# Patient Record
Sex: Male | Born: 1990 | Race: Black or African American | Hispanic: No | Marital: Single | Smoking: Current some day smoker
Health system: Southern US, Community
[De-identification: ages and names within clinical notes are randomized; demographics above are authoritative.]

## PROBLEM LIST (undated history)

## (undated) DIAGNOSIS — I639 Cerebral infarction, unspecified: Secondary | ICD-10-CM

## (undated) DIAGNOSIS — I10 Essential (primary) hypertension: Secondary | ICD-10-CM

## (undated) HISTORY — DX: Essential (primary) hypertension: I10

## (undated) HISTORY — DX: Cerebral infarction, unspecified: I63.9

---
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
## 2017-09-07 ENCOUNTER — Emergency Department (HOSPITAL_COMMUNITY): Payer: Self-pay

## 2017-09-07 ENCOUNTER — Other Ambulatory Visit: Payer: Self-pay

## 2017-09-07 ENCOUNTER — Encounter (HOSPITAL_COMMUNITY): Payer: Self-pay | Admitting: Emergency Medicine

## 2017-09-07 DIAGNOSIS — L03115 Cellulitis of right lower limb: Secondary | ICD-10-CM | POA: Insufficient documentation

## 2017-09-07 DIAGNOSIS — B353 Tinea pedis: Secondary | ICD-10-CM | POA: Insufficient documentation

## 2017-09-07 NOTE — ED Triage Notes (Signed)
Pt c/o right foot pain with swelling since yesterday. Pt denies any injury.

## 2017-09-08 ENCOUNTER — Emergency Department (HOSPITAL_COMMUNITY)
Admission: EM | Admit: 2017-09-08 | Discharge: 2017-09-08 | Disposition: A | Discharge status: Home / Self Care | Payer: Self-pay | Attending: Emergency Medicine | Admitting: Emergency Medicine

## 2017-09-08 DIAGNOSIS — L03119 Cellulitis of unspecified part of limb: Secondary | ICD-10-CM

## 2017-09-08 DIAGNOSIS — B353 Tinea pedis: Secondary | ICD-10-CM

## 2017-09-08 MED ORDER — CEPHALEXIN 250 MG/5ML PO SUSR: 500.0000 mg | Freq: Four times a day (QID) | ORAL | 0 refills | Status: AC

## 2017-09-08 NOTE — ED Notes (Signed)
Pt ambulatory to waiting room. Pt verbalized understanding of discharge instructions.   

## 2017-09-08 NOTE — ED Provider Notes (Signed)
[facility]GENCY DEPARTMENT Provider Note   CSN: [id number] Arrival date & time: 09/07/17  2108     History   Chief Complaint Chief Complaint  Patient presents with  . Foot Pain    HPI Jose Cross is a 27 y.o. male.  Patient is a 27 year old male presenting for evaluation of right foot pain and swelling.  This is been worsening over the past several days.  He denies any injury or trauma.  He denies any fevers or chills.   The history is provided by the patient.  Foot Pain  This is a new problem. Episode onset: 3 days ago. The problem occurs constantly. The problem has been rapidly worsening. The symptoms are aggravated by walking. Nothing relieves the symptoms. He has tried nothing for the symptoms.    History reviewed. No pertinent past medical history.  There are no active problems to display for this patient.   History reviewed. No pertinent surgical history.     Home Medications    Prior to Admission medications   Not on File    Family History No family history on file.  Social History Social History   Tobacco Use  . Smoking status: Never Smoker  . Smokeless tobacco: Never Used  Substance Use Topics  . Alcohol use: Yes    Frequency: Never  . Drug use: Yes    Types: Marijuana     Allergies   Patient has no allergy information on record.   Review of Systems Review of Systems  All other systems reviewed and are negative.    Physical Exam Updated Vital Signs BP (!) 154/90 (BP Location: Right Arm)   Pulse 92   Temp (!) 97.4 F (36.3 C) (Oral)   Resp 18   SpO2 99%   Physical Exam  Constitutional: He is oriented to person, place, and time. He appears well-developed and well-nourished. No distress.  HENT:  Head: Normocephalic and atraumatic.  Neck: Normal range of motion. Neck supple.  Pulmonary/Chest: Effort normal.  Neurological: He is alert and oriented to person, place, and time.  Skin: Skin is warm and dry. He is not  diaphoretic.  The left foot is noted to have redness, swelling, and warmth to the dorsum.  There is some cracking between the toes consistent with athlete's foot.  Nursing note and vitals reviewed.    ED Treatments / Results  Labs (all labs ordered are listed, but only abnormal results are displayed) Labs Reviewed - No data to display  EKG  EKG Interpretation None       Radiology Dg Foot Complete Right  Result Date: 09/07/2017 CLINICAL DATA:  Generalized right foot pain and swelling since yesterday without known injury. EXAM: RIGHT FOOT COMPLETE - 3+ VIEW COMPARISON:  None. FINDINGS: Soft tissue swelling along the mid and forefoot more so dorsomedially. No soft tissue mineralization or mass. No extra articular erosions. No acute fracture, bone destruction or joint dislocations. No radiopaque foreign body is identified. IMPRESSION: Nonspecific soft tissue swelling of the mid and forefoot more so dorsomedially. No acute osseous appearing abnormality. No radiographic findings of crystalline arthropathy or osteomyelitis. Electronically Signed   By: Tollie Ethavid  Kwon M.D.   On: 09/07/2017 21:57    Procedures Procedures (including critical care time)  Medications Ordered in ED Medications - No data to display   Initial Impression / Assessment and Plan / ED Course  I have reviewed the triage vital signs and the nursing notes.  Pertinent labs & imaging results that  were available during my care of the patient were reviewed by me and considered in my medical decision making (see chart for details).  X-rays are negative for acute abnormality.  This appears to be a cellulitis which will be treated with Keflex.  Final Clinical Impressions(s) / ED Diagnoses   Final diagnoses:  None    ED Discharge Orders    None       Jose Lyons, MD 09/08/17 0230

## 2017-09-08 NOTE — Discharge Instructions (Signed)
Keflex as prescribed.  Ibuprofen 600 mg every 6 hours as needed for pain.  Follow-up with your primary doctor if not improving in the next several days.

## 2019-05-04 IMAGING — DX DG FOOT COMPLETE 3+V*R*
3 series · 3 of 3 positions shown · non-contrast
Comparison: None.

CLINICAL DATA: Generalized right foot pain and swelling since
yesterday without known injury.

EXAM:
RIGHT FOOT COMPLETE - 3+ VIEW

[foot obl]
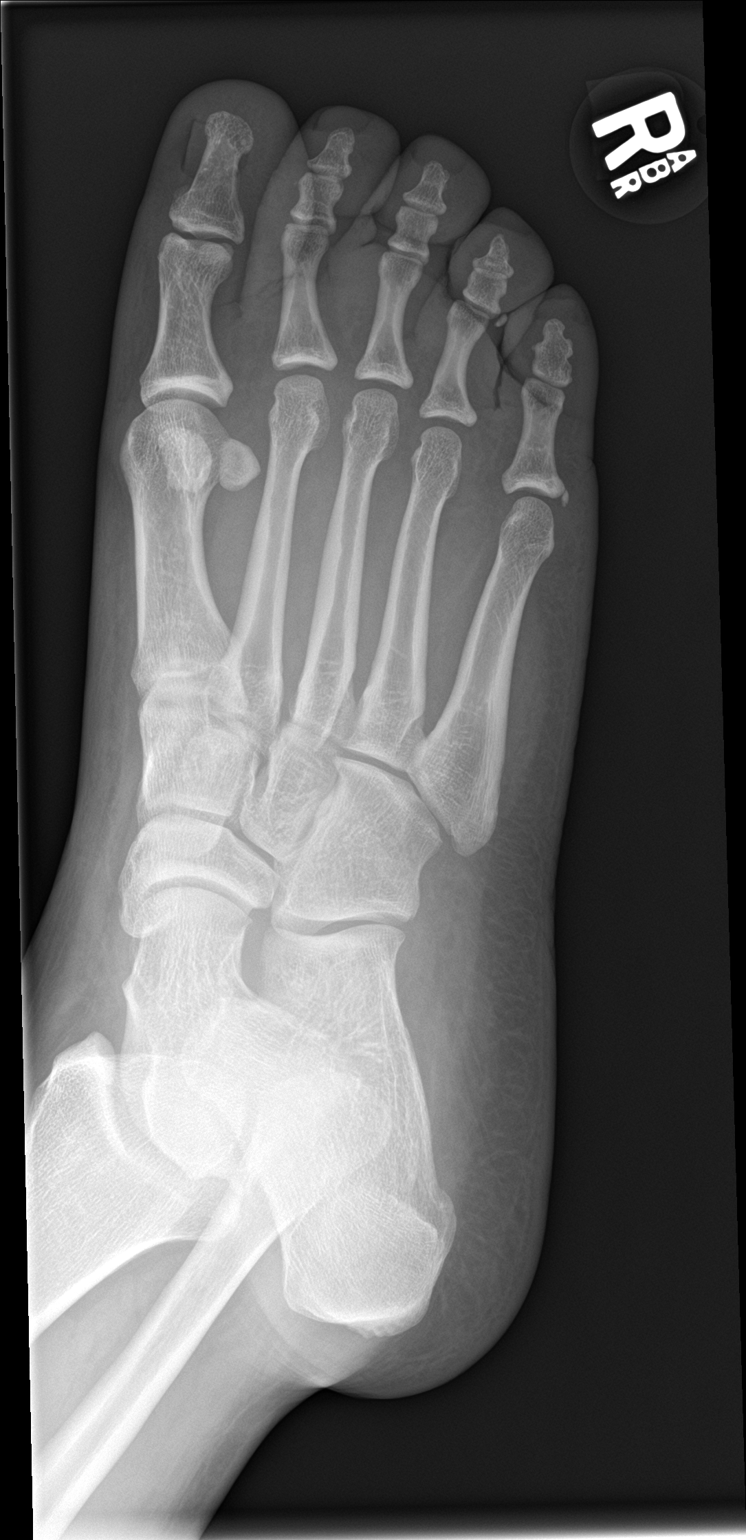

[foot lat]
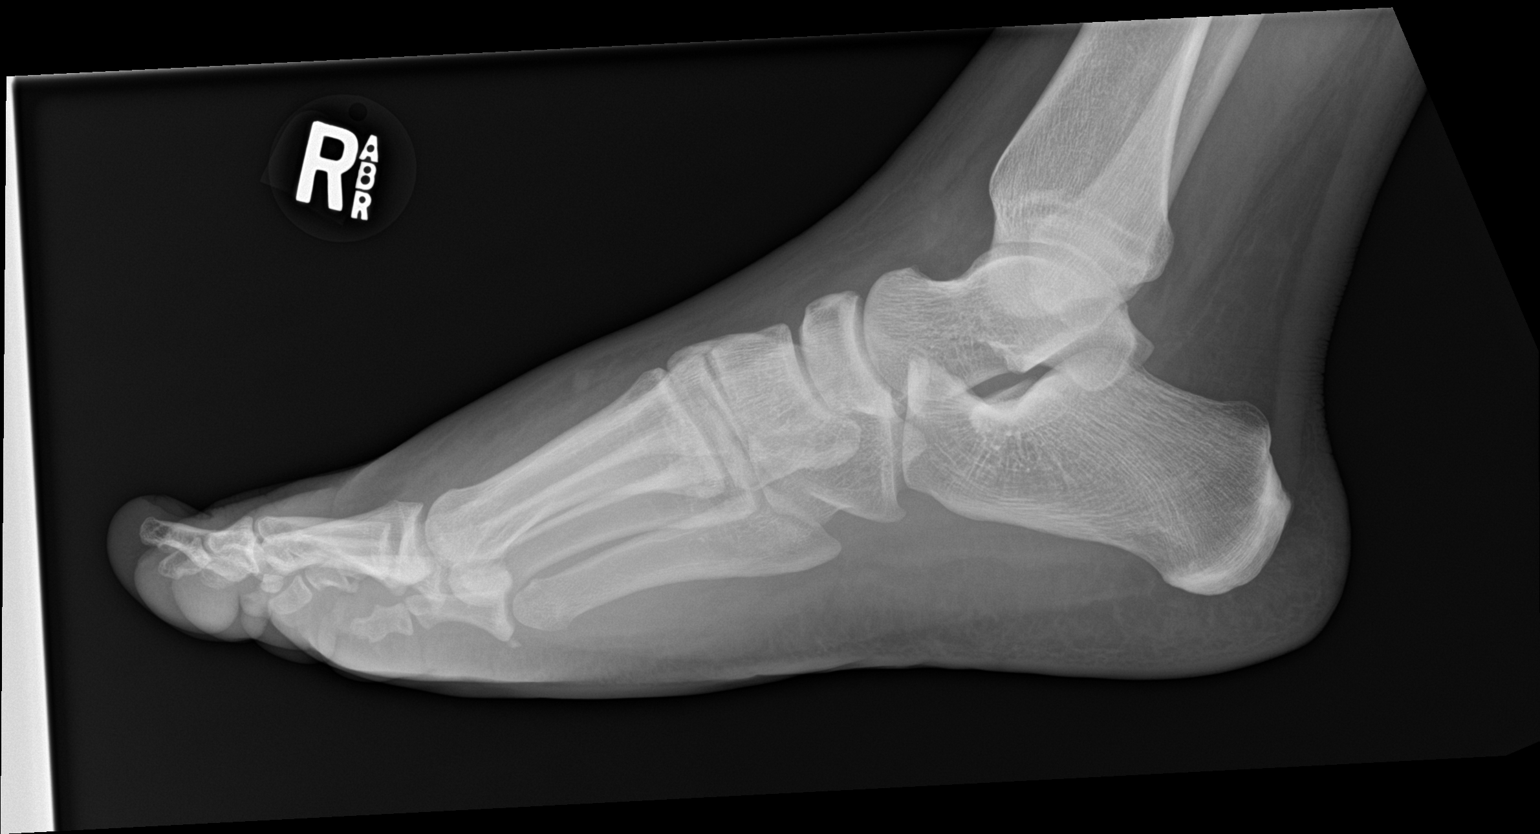

[foot ap]
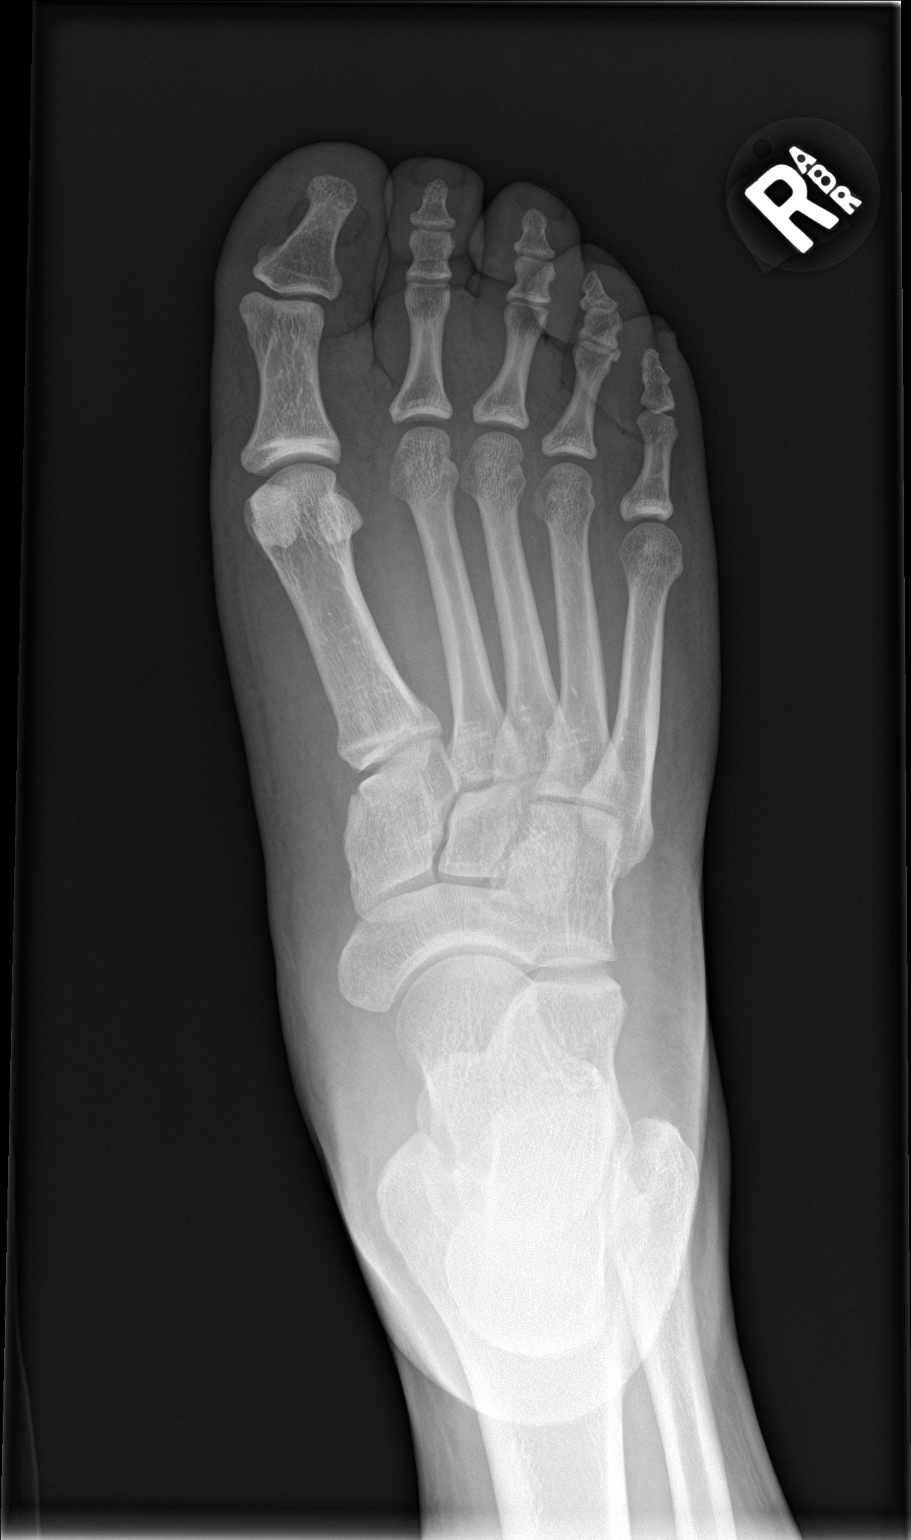

[3 of 3 positions shown; findings below may reference images not displayed]

FINDINGS: Soft tissue swelling along the mid and forefoot more so
dorsomedially. No soft tissue mineralization or mass. No extra
articular erosions. No acute fracture, bone destruction or joint
dislocations. No radiopaque foreign body is identified.
IMPRESSION: Nonspecific soft tissue swelling of the mid and forefoot more so
dorsomedially. No acute osseous appearing abnormality. No
radiographic findings of crystalline arthropathy or osteomyelitis.

## 2023-08-06 ENCOUNTER — Other Ambulatory Visit: Payer: Self-pay

## 2023-08-06 ENCOUNTER — Emergency Department (HOSPITAL_COMMUNITY)
Admission: EM | Admit: 2023-08-06 | Discharge: 2023-08-07 | Disposition: A | Discharge status: Home / Self Care | Payer: Commercial Managed Care - HMO | Attending: Emergency Medicine | Admitting: Emergency Medicine

## 2023-08-06 ENCOUNTER — Encounter (HOSPITAL_COMMUNITY): Payer: Self-pay

## 2023-08-06 DIAGNOSIS — R519 Headache, unspecified: Secondary | ICD-10-CM | POA: Diagnosis present

## 2023-08-06 DIAGNOSIS — I10 Essential (primary) hypertension: Secondary | ICD-10-CM | POA: Diagnosis not present

## 2023-08-06 DIAGNOSIS — H5712 Ocular pain, left eye: Secondary | ICD-10-CM | POA: Diagnosis not present

## 2023-08-06 NOTE — ED Triage Notes (Signed)
States he feels like he has pressure behind his  left eye. States this al started yesterday. He though it was related to a few alcohol drinks he had yesterday, but the pressure continues today and has become worse. States he has vomited 3 times, which relieved some pressure. Also states his blood pressure is high. Patient does not have a PCP and is not taking any BP medication.

## 2023-08-07 ENCOUNTER — Emergency Department (HOSPITAL_COMMUNITY): Payer: Commercial Managed Care - HMO

## 2023-08-07 ENCOUNTER — Telehealth: Payer: Self-pay

## 2023-08-07 LAB — URINALYSIS, ROUTINE W REFLEX MICROSCOPIC
Bilirubin Urine: NEGATIVE
Glucose, UA: NEGATIVE mg/dL
Hgb urine dipstick: NEGATIVE
Ketones, ur: NEGATIVE mg/dL
Leukocytes,Ua: NEGATIVE
Nitrite: NEGATIVE
Protein, ur: NEGATIVE mg/dL
Specific Gravity, Urine: 1.012 (ref 1.005–1.030)
pH: 8 (ref 5.0–8.0)

## 2023-08-07 LAB — BASIC METABOLIC PANEL
Anion gap: 10 (ref 5–15)
BUN: 9 mg/dL (ref 6–20)
CO2: 23 mmol/L (ref 22–32)
Calcium: 9.6 mg/dL (ref 8.9–10.3)
Chloride: 104 mmol/L (ref 98–111)
Creatinine, Ser: 0.83 mg/dL (ref 0.61–1.24)
GFR, Estimated: >60 mL/min (ref >60)
Glucose, Bld: 120 mg/dL — ABNORMAL HIGH (ref 70–99)
Potassium: 3.7 mmol/L (ref 3.5–5.1)
Sodium: 137 mmol/L (ref 135–145)

## 2023-08-07 LAB — CBC WITH DIFFERENTIAL/PLATELET
Abs Immature Granulocytes: 0.02 10*3/uL (ref 0.00–0.07)
Basophils Absolute: 0.1 10*3/uL (ref 0.0–0.1)
Basophils Relative: 1 %
Eosinophils Absolute: 0 10*3/uL (ref 0.0–0.5)
Eosinophils Relative: 0 %
HCT: 42.1 % (ref 39.0–52.0)
Hemoglobin: 14.1 g/dL (ref 13.0–17.0)
Immature Granulocytes: 0 %
Lymphocytes Relative: 14 %
Lymphs Abs: 1.2 10*3/uL (ref 0.7–4.0)
MCH: 30.5 pg (ref 26.0–34.0)
MCHC: 33.5 g/dL (ref 30.0–36.0)
MCV: 90.9 fL (ref 80.0–100.0)
Monocytes Absolute: 0.5 10*3/uL (ref 0.1–1.0)
Monocytes Relative: 5 %
Neutro Abs: 6.8 10*3/uL (ref 1.7–7.7)
Neutrophils Relative %: 80 %
Platelets: 293 10*3/uL (ref 150–400)
RBC: 4.63 MIL/uL (ref 4.22–5.81)
RDW: 12.8 % (ref 11.5–15.5)
WBC: 8.6 10*3/uL (ref 4.0–10.5)
nRBC: 0 % (ref 0.0–0.2)

## 2023-08-07 LAB — SEDIMENTATION RATE: Sed Rate: 15 mm/h (ref 0–16)

## 2023-08-07 MED ORDER — KETOROLAC TROMETHAMINE 15 MG/ML IJ SOLN
15.0000 mg | Freq: Once | INTRAMUSCULAR | Status: AC
  Administered 2023-08-07: 15 mg via INTRAVENOUS
  Filled 2023-08-07: qty 1

## 2023-08-07 MED ORDER — DIPHENHYDRAMINE HCL 50 MG/ML IJ SOLN
25.0000 mg | Freq: Once | INTRAMUSCULAR | Status: AC
  Administered 2023-08-07: 25 mg via INTRAVENOUS
  Filled 2023-08-07: qty 1

## 2023-08-07 MED ORDER — IOHEXOL 350 MG/ML SOLN
75.0000 mL | Freq: Once | INTRAVENOUS | Status: AC | PRN
  Administered 2023-08-07: 75 mL via INTRAVENOUS

## 2023-08-07 MED ORDER — METOCLOPRAMIDE HCL 5 MG/ML IJ SOLN
10.0000 mg | Freq: Once | INTRAMUSCULAR | Status: AC
  Administered 2023-08-07: 10 mg via INTRAVENOUS
  Filled 2023-08-07: qty 2

## 2023-08-07 MED ORDER — GADOBUTROL 1 MMOL/ML IV SOLN
10.0000 mL | Freq: Once | INTRAVENOUS | Status: AC | PRN
  Administered 2023-08-07: 10 mL via INTRAVENOUS

## 2023-08-07 MED ORDER — HYDRALAZINE HCL 20 MG/ML IJ SOLN
5.0000 mg | Freq: Once | INTRAMUSCULAR | Status: AC
  Administered 2023-08-07: 5 mg via INTRAVENOUS
  Filled 2023-08-07: qty 1

## 2023-08-07 MED ORDER — ASPIRIN 81 MG PO CHEW: 81.0000 mg | CHEWABLE_TABLET | Freq: Every day | ORAL | 0 refills | Status: AC

## 2023-08-07 MED ORDER — AMLODIPINE BESYLATE 5 MG PO TABS: 5.0000 mg | ORAL_TABLET | Freq: Every day | ORAL | 0 refills | Status: DC

## 2023-08-07 MED ORDER — FLUORESCEIN SODIUM 1 MG OP STRP
1.0000 | ORAL_STRIP | Freq: Once | OPHTHALMIC | Status: AC
  Administered 2023-08-07: 1 via OPHTHALMIC
  Filled 2023-08-07: qty 1

## 2023-08-07 MED ORDER — TETRACAINE HCL 0.5 % OP SOLN
2.0000 [drp] | Freq: Once | OPHTHALMIC | Status: AC
  Administered 2023-08-07: 2 [drp] via OPHTHALMIC
  Filled 2023-08-07: qty 4

## 2023-08-07 NOTE — Discharge Instructions (Addendum)
Take your blood pressure medication as prescribed.  Keep a record of your blood pressure at home and follow-up with your primary doctor for further medication adjustments.  Follow-up with the ophthalmologist regarding your eye pain. Take your blood pressure medication as prescribed.  Also take a baby aspirin 81 mg daily.  Follow-up with the neurologist for further evaluation of your elevated blood pressure and old stroke. Thank you for the opportunity to take care of you in our Emergency Department. You have been diagnosed with high blood pressure, also known as hypertension. This means that the force of blood against the walls of your blood vessels called is too strong. It also means that your heart has to work harder to move the blood. High blood pressure usually has no symptoms, but over time, it can cause serious health problems such as Heart attack and heart failure Stroke Kidney disease and failure Vision loss With the help from your healthcare provider and some important life style changes, you can manage your blood pressure and protect your health. Please read the instructions provided on hypertension, how to manage it and how to check your blood pressure. Additionally, use the blood pressure log provided to record your blood pressures. Take the blood pressure log with you to your primary care doctor so that they can adjust your blood pressure medications if needed. Please read the instructions on follow-up appointment. Return to the ER or Call 911 right away if you have any of these symptoms: Chest pain or shortness of breath Severe headache Weakness, tingling, or numbness of your face, arms, or legs (especially on 1 side of the body) Sudden change in vision Confusion, trouble speaking, or trouble understanding speech

## 2023-08-07 NOTE — ED Provider Notes (Signed)
[place] EMERGENCY DEPARTMENT AT [facility] Provider Note   CSN: [id number] Arrival date & time: 08/06/23  2300     History  Chief Complaint  Patient presents with   Eye Problem    Jose Cross is a 32 y.o. male.  Patient Jose Cross to the ED with "eye pain" which has been ongoing for the past 2 days.  States he had several drinks in the evening of December 19 and woke up the morning of December 20 with pain behind his left eye that comes and goes.  It waxes and wanes in severity but never goes away completely.  Feels like there is pain and pressure behind his left eye but denies having a headache.  He was not certain if this feeling could be from elevated blood pressure could be from a hangover.  Denies any blurry vision or double vision.  Denies any spots in his vision.  He had 2 episodes of vomiting today and feels like the pressure is less behind his eye.  Denies any eye trauma.  No focal weakness, numbness or tingling.  No room spinning dizziness or lightheadedness.  He does wear glasses at baseline but states his vision is unchanged.  No fevers.  No chest pain or shortness of breath.  No pain with eye movements  Hypertensive on arrival.  States he does have a known history of hypertension that was diagnosed at urgent care last month.  He has a PCP appointment in January to see a doctor.  He has never been on blood pressure medication.  The history is provided by the patient and a relative.  Eye Problem Associated symptoms: headaches, nausea and vomiting   Associated symptoms: no discharge, no itching, no photophobia, no redness and no weakness        Home Medications Prior to Admission medications   Not on File      Allergies    Patient has no known allergies.    Review of Systems   Review of Systems  Constitutional:  Negative for activity change, appetite change and fever.  HENT:  Negative for congestion and rhinorrhea.   Eyes:  Positive for pain.  Negative for photophobia, discharge, redness, itching and visual disturbance.  Respiratory:  Negative for cough and shortness of breath.   Cardiovascular:  Negative for chest pain.  Gastrointestinal:  Positive for nausea and vomiting. Negative for abdominal pain.  Genitourinary:  Negative for dysuria and hematuria.  Musculoskeletal:  Negative for arthralgias and myalgias.  Skin:  Negative for rash.  Neurological:  Positive for headaches. Negative for weakness.   all other systems are negative except as noted in the HPI and PMH.    Physical Exam Updated Vital Signs BP (!) 195/115 (BP Location: Left Arm)   Pulse 74   Temp 97.6 F (36.4 C)   Resp 15   Ht 5\' 9"  (1.753 m)   Wt 93.9 kg   SpO2 100%   BMI 30.57 kg/m  Physical Exam Vitals and nursing note reviewed.  Constitutional:      General: He is not in acute distress.    Appearance: He is well-developed.  HENT:     Head: Normocephalic and atraumatic.     Mouth/Throat:     Pharynx: No oropharyngeal exudate.  Eyes:     General: No visual field deficit.    Intraocular pressure: Left eye pressure is 18 mmHg. Measurements were taken using a handheld tonometer.    Extraocular Movements: Extraocular movements intact.  Conjunctiva/sclera: Conjunctivae normal.     Pupils: Pupils are equal, round, and reactive to light.     Left eye: No corneal abrasion or fluorescein uptake.     Slit lamp exam:    Left eye: No foreign body, hyphema or hypopyon.     Comments: No hyphema or hypopyon Visual fields full to confrontation.  No corneal abrasion.  Neck:     Comments: No meningismus. Cardiovascular:     Rate and Rhythm: Normal rate and regular rhythm.     Heart sounds: Murmur heard.  Pulmonary:     Effort: Pulmonary effort is normal. No respiratory distress.     Breath sounds: Normal breath sounds.  Abdominal:     Palpations: Abdomen is soft.     Tenderness: There is no abdominal tenderness. There is no guarding or rebound.   Musculoskeletal:        General: No tenderness. Normal range of motion.     Cervical back: Normal range of motion and neck supple.  Skin:    General: Skin is warm.  Neurological:     Mental Status: He is alert and oriented to person, place, and time.     Cranial Nerves: No cranial nerve deficit.     Motor: No abnormal muscle tone.     Coordination: Coordination normal.     Comments: No ataxia on finger to nose bilaterally. No pronator drift. 5/5 strength throughout. CN 2-12 intact.Equal grip strength. Sensation intact.   Psychiatric:        Behavior: Behavior normal.     ED Results / Procedures / Treatments   Labs (all labs ordered are listed, but only abnormal results are displayed) Labs Reviewed  BASIC METABOLIC PANEL - Abnormal; Notable for the following components:      Result Value   Glucose, Bld 120 (*)    All other components within normal limits  URINALYSIS, ROUTINE W REFLEX MICROSCOPIC - Abnormal; Notable for the following components:   Color, Urine STRAW (*)    All other components within normal limits  CBC WITH DIFFERENTIAL/PLATELET  SEDIMENTATION RATE    EKG EKG Interpretation Date/Time:  Monday August 07 2023 02:43:05 EST Ventricular Rate:  80 PR Interval:  160 QRS Duration:  89 QT Interval:  358 QTC Calculation: 413 R Axis:   93  Text Interpretation: Sinus rhythm Borderline right axis deviation No previous ECGs available Confirmed by Glynn Octave [phone]) on 08/07/2023 3:01:30 AM  Radiology MR Brain W and Wo Contrast Result Date: 08/07/2023 CLINICAL DATA:  Headache, increasing frequency or severity EXAM: MRI HEAD AND ORBITS WITHOUT AND WITH CONTRAST TECHNIQUE: Multiplanar, multiecho pulse sequences of the brain and surrounding structures were obtained without and with intravenous contrast. Multiplanar, multiecho pulse sequences of the orbits and surrounding structures were obtained including fat saturation techniques, before and after intravenous  contrast administration. CONTRAST:  10mL GADAVIST GADOBUTROL 1 MMOL/ML IV SOLN COMPARISON:  None Available. FINDINGS: MRI HEAD FINDINGS Brain: No restricted diffusion with ADC correlate to suggest acute infarct. A focus of increased signal in the right posterior lentiform nucleus on the diffusion-weighted sequence (series 7, image 58) correlates with normal to slightly increased signal on the ADC map and increased signal on T2 weighted imaging, possibly reflecting a subacute infarct or T2 shine through from more remote infarct. No evidence of acute hemorrhage, mass, mass effect or midline shift. No hydrocephalus or extra-axial collection. No hemosiderin deposition to suggest remote hemorrhage Scattered foci of T2 hyperintense signal in the periventricular and subcortical white  matter, which are nonspecific and can be seen in the setting of prior insult, chronic migraines, or early small vessel ischemic disease; these can also be seen in the setting of demyelinating disease, although the pattern is not particularly suggestive of demyelination. Vascular: Normal arterial flow voids. Normal arterial and venous enhancement. Skull and upper cervical spine: Normal marrow signal. MRI ORBITS FINDINGS Orbits: No abnormal enhancement. No traumatic or inflammatory finding. Globes, optic nerves, orbital fat, extraocular muscles, vascular structures, and lacrimal glands are normal. Visualized sinuses: Minimal mucosal thickening in the ethmoid air cells. Otherwise clear paranasal sinuses. The mastoids are well aerated. Soft tissues: Negative. IMPRESSION: 1. No acute intracranial process. 2. No evidence of acute infarct. 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 3. Scattered foci of T2 hyperintense signal in the periventricular and subcortical white matter, which are nonspecific and can be seen in the setting of prior insult, chronic migraines, or early small vessel ischemic disease. The pattern is not  particularly suggestive of demyelination. 4. No acute finding in the orbits. Electronically Signed   By: Wiliam Ke M.D.   On: 08/07/2023 04:08   MR ORBITS W WO CONTRAST Result Date: 08/07/2023 CLINICAL DATA:  Headache, increasing frequency or severity EXAM: MRI HEAD AND ORBITS WITHOUT AND WITH CONTRAST TECHNIQUE: Multiplanar, multiecho pulse sequences of the brain and surrounding structures were obtained without and with intravenous contrast. Multiplanar, multiecho pulse sequences of the orbits and surrounding structures were obtained including fat saturation techniques, before and after intravenous contrast administration. CONTRAST:  10mL GADAVIST GADOBUTROL 1 MMOL/ML IV SOLN COMPARISON:  None Available. FINDINGS: MRI HEAD FINDINGS Brain: No restricted diffusion with ADC correlate to suggest acute infarct. A focus of increased signal in the right posterior lentiform nucleus on the diffusion-weighted sequence (series 7, image 58) correlates with normal to slightly increased signal on the ADC map and increased signal on T2 weighted imaging, possibly reflecting a subacute infarct or T2 shine through from more remote infarct. No evidence of acute hemorrhage, mass, mass effect or midline shift. No hydrocephalus or extra-axial collection. No hemosiderin deposition to suggest remote hemorrhage Scattered foci of T2 hyperintense signal in the periventricular and subcortical white matter, which are nonspecific and can be seen in the setting of prior insult, chronic migraines, or early small vessel ischemic disease; these can also be seen in the setting of demyelinating disease, although the pattern is not particularly suggestive of demyelination. Vascular: Normal arterial flow voids. Normal arterial and venous enhancement. Skull and upper cervical spine: Normal marrow signal. MRI ORBITS FINDINGS Orbits: No abnormal enhancement. No traumatic or inflammatory finding. Globes, optic nerves, orbital fat, extraocular  muscles, vascular structures, and lacrimal glands are normal. Visualized sinuses: Minimal mucosal thickening in the ethmoid air cells. Otherwise clear paranasal sinuses. The mastoids are well aerated. Soft tissues: Negative. IMPRESSION: 1. No acute intracranial process. 2. No evidence of acute infarct. 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 3. Scattered foci of T2 hyperintense signal in the periventricular and subcortical white matter, which are nonspecific and can be seen in the setting of prior insult, chronic migraines, or early small vessel ischemic disease. The pattern is not particularly suggestive of demyelination. 4. No acute finding in the orbits. Electronically Signed   By: Wiliam Ke M.D.   On: 08/07/2023 04:08   CT ANGIO HEAD NECK W WO CM Result Date: 08/07/2023 CLINICAL DATA:  Pressure behind his left eye, vomiting, hypertension EXAM: CT ANGIOGRAPHY HEAD AND NECK WITH AND  WITHOUT CONTRAST TECHNIQUE: Multidetector CT imaging of the head and neck was performed using the standard protocol during bolus administration of intravenous contrast. Multiplanar CT image reconstructions and MIPs were obtained to evaluate the vascular anatomy. Carotid stenosis measurements (when applicable) are obtained utilizing NASCET criteria, using the distal internal carotid diameter as the denominator. RADIATION DOSE REDUCTION: This exam was performed according to the departmental dose-optimization program which includes automated exposure control, adjustment of the mA and/or kV according to patient size and/or use of iterative reconstruction technique. CONTRAST:  75mL OMNIPAQUE IOHEXOL 350 MG/ML SOLN COMPARISON:  None Available. FINDINGS: CT HEAD FINDINGS Brain: No evidence of acute infarct, hemorrhage, mass, mass effect, or midline shift. No hydrocephalus or extra-axial fluid collection. Vascular: No hyperdense vessel. Skull: Negative for fracture or focal lesion. Sinuses/Orbits: Clear  paranasal sinuses. No acute finding in the orbits. Other: Fluid in the right mastoid air cells. CTA NECK FINDINGS Aortic arch: Standard branching. Imaged portion shows no evidence of aneurysm or dissection. No significant stenosis of the major arch vessel origins. Right carotid system: No evidence of stenosis, dissection, or occlusion. Left carotid system: No evidence of stenosis, dissection, or occlusion. Vertebral arteries: No evidence of stenosis, dissection, or occlusion. Skeleton: No acute osseous abnormality. Other neck: Prominent upper cervical lymph nodes, including a left level 1A lymph node that measures up to 5 mm in short axis, a left level 2B lymph node that measures up to 8 mm in short axis, and a right level 2B lymph node that measures up to 8 mm in short axis. No abnormal density lymph nodes. Upper chest: No focal pulmonary opacity or pleural effusion. Review of the MIP images confirms the above findings CTA HEAD FINDINGS Anterior circulation: Both internal carotid arteries are patent to the termini, without significant stenosis. A1 segments patent. Normal anterior communicating artery. Anterior cerebral arteries are patent to their distal aspects without significant stenosis. No M1 stenosis or occlusion. MCA branches perfused to their distal aspects without significant stenosis. Posterior circulation: Vertebral arteries patent to the vertebrobasilar junction without significant stenosis. Posterior inferior cerebellar arteries patent proximally. Basilar patent to its distal aspect without significant stenosis. Superior cerebellar arteries patent proximally. Patent P1 segments. PCAs perfused to their distal aspects without significant stenosis. The bilateral posterior communicating arteries are not visualized. Venous sinuses: As permitted by contrast timing, patent. Anatomic variants: None significant. No evidence of aneurysm or vascular malformation. Review of the MIP images confirms the above  findings IMPRESSION: 1. No acute intracranial process. 2. No intracranial large vessel occlusion or significant stenosis. 3. No hemodynamically significant stenosis in the neck. 4. Prominent upper cervical lymph nodes, which are nonspecific and may be reactive. Electronically Signed   By: Wiliam Ke M.D.   On: 08/07/2023 02:05    Procedures Procedures    Medications Ordered in ED Medications  fluorescein ophthalmic strip 1 strip (has no administration in time range)  tetracaine (PONTOCAINE) 0.5 % ophthalmic solution 2 drop (has no administration in time range)  hydrALAZINE (APRESOLINE) injection 5 mg (has no administration in time range)  ketorolac (TORADOL) 15 MG/ML injection 15 mg (has no administration in time range)  metoCLOPramide (REGLAN) injection 10 mg (has no administration in time range)  diphenhydrAMINE (BENADRYL) injection 25 mg (has no administration in time range)    ED Course/ Medical Decision Making/ A&P  Medical Decision Making Amount and/or Complexity of Data Reviewed Labs: ordered. Decision-making details documented in ED Course. Radiology: ordered and independent interpretation performed. Decision-making details documented in ED Course. ECG/medicine tests: ordered and independent interpretation performed. Decision-making details documented in ED Course.  Risk OTC drugs. Prescription drug management.   3 days of intermittent pain and pressure behind left eye that comes and goes.  Does not feel like this is a headache.  2 episodes of vomiting today which improved.  Hypertensive on arrival with history of same.  Neurological exam is nonfocal.  Intraocular pressure is normal. No areas of fluorescein uptake. Visual Acuity  Bilateral Near 20/25      Bilateral Distance 20/40      R Near 20/32      R Distance 20/50      L Near 20/32      L Distance 20/40       Labs obtained to evaluate for endorgan damage from his hypertension.   Creatinine is normal.  Urinalysis is negative.  No proteinuria.  CT head obtained given his headache and eye pain. Given a migraine cocktail as well.  After 5 mg of amlodipine, blood pressure has improved to 175/96.  Patient reports resolution of head and eye pain after receiving Toradol, Reglan and Benadryl.  Low suspicion for glaucoma or intraocular hemorrhage.  CT head negative for hemorrhage or large vessel occlusion.  No aneurysm.  MRI negative for acute infarct.  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  Discussed with Dr. Derry Lory  neurology who feels this is chronic and recommends blood pressure control and aspirin and outpatient follow-up.  MRI does show questionable white matter changes that are nonspecific.  Not favored to represent demyelination.  Discussed with patient he need strict blood pressure control, start baby aspirin, follow-up with PCP as well as neurology.  Will start amlodipine.  Advised patient to get blood pressure cuff for home and check blood pressure on a regular basis.  Follow up ophthalmology regarding his eye pain which has resolved after receiving headache cocktail.  Return to the ED with headache, visual changes, chest pain, shortness of breath, nausea, vomiting, sweating or other concerns.  Discussed with patient and mother at bedside.  Discussed strict blood pressure control, PCP and neurology follow-up.  Start baby aspirin.  Follow-up with PCP for further blood pressure medication adjustments.  Return to the ED with exertional chest pain, pain associate with shortness of breath, nausea, vomiting, sweating, headache, visual changes, unilateral weakness, numbness, tingling or other concerns.       Final Clinical Impression(s) / ED Diagnoses Final diagnoses:  Left eye pain  Bad headache  Uncontrolled hypertension    Rx / DC Orders ED Discharge Orders     None         Jendayi Berling, Jeannett Senior, MD 08/07/23  [phone]

## 2023-08-07 NOTE — Progress Notes (Unsigned)
Care Guide Pharmacy Note  08/07/2023 Name: Jose Cross MRN: [id number] DOB: 1990-09-26  Referred By: Patient, No Pcp Per Reason for referral: Care Coordination (Outreach to schedule with Pharm d )   Jose Cross is a 32 y.o. year old male who is a primary care patient of Patient, No Pcp Per.  Norman Clay was referred to the pharmacist for assistance related to: HTN  An unsuccessful telephone outreach was attempted today to contact the patient who was referred to the pharmacy team for assistance with medication management. Additional attempts will be made to contact the patient.  Penne Lash , RMA     [facility]  [facility], [facility] Guide  Direct Dial: [phone]  Website: [email or web address]

## 2023-08-11 ENCOUNTER — Other Ambulatory Visit: Payer: Commercial Managed Care - HMO

## 2023-08-11 NOTE — Progress Notes (Signed)
   08/11/2023  Patient ID: Jose Cross, male   DOB: 10-27-1990, 32 y.o.   MRN: [id number]  Contacted patient via telephone to follow up on recent ED visit for HTN.  Confirmed patient is taking Amlodipine 5mg  daily and Aspirin 81mg  once daily.  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 Checks BP daily at 5pm once he arrives to work and is reporting readings of >170/>100. However, is checking BP and then taking amlodipine at the same time.  Counseled on proper BP monitoring technique. Patient expressed understanding. Counseled on limiting sodium intake.  Patient would like appt with a new PCP as soon as possible, will coordinate with Tyson Dense.   Patient also states he has not heard back on referral to Neuro. Provided their contact info and also reminded him to schedule with opthamalogy as requested at ED. Patient expressed understanding.  If BP still elevated, would consider increase of Amlodipine to 10mg  daily and if markedly elevated, consider addition of thiazide diuretic or ACEI/ARB.  Sherrill Raring, PharmD Clinical Pharmacist [phone]

## 2023-09-07 ENCOUNTER — Telehealth: Payer: Self-pay

## 2023-09-07 NOTE — Telephone Encounter (Signed)
Left voicemail to call back to schedule new patient appt

## 2023-09-12 ENCOUNTER — Encounter: Payer: Self-pay | Admitting: Neurology

## 2023-09-12 ENCOUNTER — Ambulatory Visit: Payer: Self-pay | Admitting: Neurology

## 2023-09-12 VITALS — BP 146/90 | HR 86 | Ht 70.0 in | Wt 202.0 lb

## 2023-09-12 DIAGNOSIS — I6381 Other cerebral infarction due to occlusion or stenosis of small artery: Secondary | ICD-10-CM

## 2023-09-12 DIAGNOSIS — I10 Essential (primary) hypertension: Secondary | ICD-10-CM

## 2023-09-12 MED ORDER — PROPRANOLOL HCL ER 60 MG PO CP24: 60.0000 mg | ORAL_CAPSULE | Freq: Every day | ORAL | 3 refills | Status: DC

## 2023-09-12 MED ORDER — AMLODIPINE BESYLATE 10 MG PO TABS: 10.0000 mg | ORAL_TABLET | Freq: Every day | ORAL | 3 refills | Status: DC

## 2023-09-12 NOTE — Progress Notes (Signed)
[facility]  PATIENT: Jose Cross DOB: 1991-02-12  REQUESTING CLINICIAN: Glynn Octave, MD HISTORY FROM: Patient  REASON FOR VISIT: Stroke found on MRI    HISTORICAL  CHIEF COMPLAINT:  Chief Complaint  Patient presents with   New Patient (Initial Visit)    Pt in 12, here alone Pt is referred by ED for white matter disease and chronic infarct on MRI. Pt states he was at work on Dec/22/24 and started getting pressure behind his left eye, started sweating profusely and feeling nauseous, ended in the ER and was informed he had a TIA and MRI showed TIA as well. Pt states he continues to have throbbing/pounding pain behind his left eye that happens randomly.     HISTORY OF PRESENT ILLNESS:  This is a 33 year old gentleman with no reported past medical history who is presenting after being last seen in the hospital for headaches and found to have stroke on MRI.  Patient reports a few weeks prior to his hospital presentation, he started developing left-sided headaches, behind the left eye.  Describes headache as pounding.  On December 22, he did have severe headache with nausea and vomiting and presented to the ED.  In the ED, his MRI showed white matter ischemic changes and subacute stroke in the right lentiform nucleus.  He was also noted to be hypertensive.  Started on amlodipine 5 mg.  Since discharge from the hospital he continues to have headaches, about 3 days a week lasting hours, sometime he can last the whole day.  He has been taking aspirin 81 mg daily without relief.  Patient reports his blood pressure is still elevated in the 150s systolic at home.  He has not set up care with the primary care doctor yet. He denies any previous history of headaches, no family history of headaches and no previous focal neurological deficits.    OTHER MEDICAL CONDITIONS: Hypertension    REVIEW OF SYSTEMS: Full 14 system review of systems performed and negative with  exception of: As noted in the HPI  ALLERGIES: No Known Allergies  HOME MEDICATIONS: Outpatient Medications Prior to Visit  Medication Sig Dispense Refill   aspirin 81 MG chewable tablet Chew 1 tablet (81 mg total) by mouth daily. 30 tablet 0   amLODipine (NORVASC) 5 MG tablet Take 1 tablet (5 mg total) by mouth daily. 30 tablet 0   No facility-administered medications prior to visit.    PAST MEDICAL HISTORY: Past Medical History:  Diagnosis Date   Hypertension     PAST SURGICAL HISTORY: History reviewed. No pertinent surgical history.  FAMILY HISTORY: Family History  Problem Relation Age of Onset   Stroke Maternal Uncle     SOCIAL HISTORY: Social History   Socioeconomic History   Marital status: Single    Spouse name: Not on file   Number of children: Not on file   Years of education: Not on file   Highest education level: Not on file  Occupational History   Occupation: TEam  [employer]    Comment: [employer] [facility]  Tobacco Use   Smoking status: Some Days    Types: Cigars   Smokeless tobacco: Never   Tobacco comments:    Black and milds. States has not smoked since December.   Vaping Use   Vaping status: Never Used  Substance and Sexual Activity   Alcohol use: Yes    Alcohol/week: 2.0 standard drinks of alcohol    Types: 1 Cans of beer, 1 Shots of  liquor per week    Comment: occ   Drug use: Yes    Types: Marijuana   Sexual activity: Not on file  Other Topics Concern   Not on file  Social History Narrative   Not on file   Social Drivers of Health   Financial Resource Strain: Not on file  Food Insecurity: Not on file  Transportation Needs: Not on file  Physical Activity: Not on file  Stress: Not on file  Social Connections: Not on file  Intimate Partner Violence: Not on file    PHYSICAL EXAM  GENERAL EXAM/CONSTITUTIONAL: Vitals:  Vitals:   09/12/23 0809 09/12/23 0825  BP: (!) 152/102 (!) 146/90  Pulse: 88 86  Weight: 202 lb (91.6 kg)    Height: 5\' 10"  (1.778 m)    Body mass index is 28.98 kg/m. Wt Readings from Last 3 Encounters:  09/12/23 202 lb (91.6 kg)  08/06/23 207 lb (93.9 kg)   Patient is in no distress; well developed, nourished and groomed; neck is supple  MUSCULOSKELETAL: Gait, strength, tone, movements noted in Neurologic exam below  NEUROLOGIC: MENTAL STATUS:      No data to display         awake, alert, oriented to person, place and time recent and remote memory intact normal attention and concentration language fluent, comprehension intact, naming intact fund of knowledge appropriate  CRANIAL NERVE:  2nd, 3rd, 4th, 6th - Visual fields full to confrontation, extraocular muscles intact, no nystagmus 5th - facial sensation symmetric 7th - facial strength symmetric 8th - hearing intact 9th - palate elevates symmetrically, uvula midline 11th - shoulder shrug symmetric 12th - tongue protrusion midline  MOTOR:  normal bulk and tone, full strength in the BUE, BLE  SENSORY:  normal and symmetric to light touch  COORDINATION:  finger-nose-finger, fine finger movements normal  REFLEXES:  deep tendon reflexes present and symmetric  GAIT/STATION:  normal   DIAGNOSTIC DATA (LABS, IMAGING, TESTING) - I reviewed patient records, labs, notes, testing and imaging myself where available.  Lab Results  Component Value Date   WBC 8.6 08/07/2023   HGB 14.1 08/07/2023   HCT 42.1 08/07/2023   MCV 90.9 08/07/2023   PLT 293 08/07/2023      Component Value Date/Time   NA 137 08/07/2023 0045   K 3.7 08/07/2023 0045   CL 104 08/07/2023 0045   CO2 23 08/07/2023 0045   GLUCOSE 120 (H) 08/07/2023 0045   BUN 9 08/07/2023 0045   CREATININE 0.83 08/07/2023 0045   CALCIUM 9.6 08/07/2023 0045   GFRNONAA >60 08/07/2023 0045   No results found for: "CHOL", "HDL", "LDLCALC", "LDLDIRECT", "TRIG", "CHOLHDL" No results found for: "HGBA1C" No results found for: "VITAMINB12" No results found for:  "TSH"  MRI Brain 08/07/2023 1. No acute intracranial process. 2. No evidence of acute infarct. 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 3. Scattered foci of T2 hyperintense signal in the periventricular and subcortical white matter, which are nonspecific and can be seen in the setting of prior insult, chronic migraines, or early small vessel ischemic disease. The pattern is not particularly suggestive of demyelination. 4. No acute finding in the orbits     ASSESSMENT AND PLAN  33 y.o. year old male with history of hypertension who is presenting with complaint of headaches and found to have stroke on MRI.  Stroke etiology likely small vessel disease from hypertension.  He is currently on amlodipine 5, will increase it to 10 and add  propranolol.  Will also obtain stroke lab including lipid panel and hemoglobin A1c. Will start statin if needed. For his headaches, based on description, these are likely migraine headaches, we will start him on propranolol as preventive medication and hopefully this will also better control his blood pressure.  Advised patient to try Tylenol or Motrin as needed for the headaches.  We also discussed need of setting up care with PCP.  I have shown him on the [facility] website how to chose and actually schedule an appointment.  He will continue to follow-up with PCP and return as needed.   1. 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 Hypertension, unspecified type      Patient Instructions  Stroke labs including Lipid panel and Hemoglobin A1C  Increase amlodipine to 10 mg daily  Start Propanolol 60 mg daily  Continue Tylenol Motrin as needed for headaches Set up care with PCP  Return as needed   Orders Placed This Encounter  Procedures   Lipid panel   Hemoglobin A1c    Meds ordered this encounter  Medications   amLODipine (NORVASC) 10 MG tablet    Sig: Take 1 tablet (10 mg total) by mouth  daily.    Dispense:  90 tablet    Refill:  3   propranolol ER (INDERAL LA) 60 MG 24 hr capsule    Sig: Take 1 capsule (60 mg total) by mouth daily.    Dispense:  90 capsule    Refill:  3    Return if symptoms worsen or fail to improve.    Windell Norfolk, MD 09/12/2023, 9:55 AM  [facility] [address], [address] [place], [place] [address] [phone]

## 2023-09-12 NOTE — Patient Instructions (Addendum)
Stroke labs including Lipid panel and Hemoglobin A1C  Increase amlodipine to 10 mg daily  Start Propanolol 60 mg daily  Continue Tylenol Motrin as needed for headaches Set up care with PCP  Return as needed

## 2023-09-13 LAB — LIPID PANEL
Chol/HDL Ratio: 3.1 {ratio} (ref 0.0–5.0)
Cholesterol, Total: 141 mg/dL (ref 100–199)
HDL: 46 mg/dL (ref >39)
LDL Chol Calc (NIH): 79 mg/dL (ref 0–99)
Triglycerides: 81 mg/dL (ref 0–149)
VLDL Cholesterol Cal: 16 mg/dL (ref 5–40)

## 2023-09-13 LAB — HEMOGLOBIN A1C
Est. average glucose Bld gHb Est-mCnc: 117 mg/dL
Hgb A1c MFr Bld: 5.7 % — ABNORMAL HIGH (ref 4.8–5.6)

## 2023-09-14 ENCOUNTER — Telehealth: Payer: Self-pay

## 2023-09-14 NOTE — Progress Notes (Signed)
Please call and advise the patient that the recent stroke labs were within normal limits except for a slightly elevated LDL of 79. No further action is required on these tests at this time, as I believe your stroke was due to elevated blood pressure. Please remind patient to keep any upcoming appointments or tests and to call us with any interim questions, concerns, problems or updates. Thanks,   Windell Norfolk, MD

## 2023-09-14 NOTE — Telephone Encounter (Signed)
Call to patient, reviewed results and patient verbalized understanding. He states the Propanolol ER  60 mg tablet is too big for him to swallow. He is requesting a change in dose or medication. Advised I will send to Dr. Teresa Coombs for review.

## 2023-09-20 ENCOUNTER — Other Ambulatory Visit: Payer: Self-pay | Admitting: Neurology

## 2023-09-20 MED ORDER — PROPRANOLOL HCL 20 MG PO TABS: 20.0000 mg | ORAL_TABLET | Freq: Two times a day (BID) | ORAL | 6 refills | Status: DC

## 2023-09-20 NOTE — Telephone Encounter (Signed)
 Call to patient, advised that Dr. Samara Crest sent in new dose of propanolol with new instructions. Patient verbalized understanding and will let pharmacy know to cancel 60 mg dose.

## 2023-09-20 NOTE — Telephone Encounter (Signed)
Propanolol 60 changed to 20 mg BID.

## 2023-10-26 ENCOUNTER — Ambulatory Visit: Payer: Self-pay | Admitting: Neurology

## 2023-11-17 ENCOUNTER — Other Ambulatory Visit: Payer: Self-pay | Admitting: General Practice

## 2023-11-17 NOTE — Telephone Encounter (Signed)
 Copied from CRM [id number]. Topic: Clinical - Medication Refill >> Nov 17, 2023  3:40 PM Dondra Prader A wrote: Most Recent Primary Care Visit:  Provider: Delano Metz H  Department: [facility]  Visit Type: PATIENT OUTREACH 30  Date: 08/11/2023  Medication: amLODipine (NORVASC) 10 MG tablet propranolol (INDERAL) 20 MG tablet  Has the patient contacted their pharmacy? No   Is this the correct pharmacy for this prescription? Yes  This is the patient's preferred pharmacy:  [id number] - [place], [place] - [address] AT [address] OF [address] [place] [address] Phone: [phone] Fax: [phone]   Has the prescription been filled recently? No  Is the patient out of the medication? No Patient has 3 days worth of medication left.   Has the patient been seen for an appointment in the last year OR does the patient have an upcoming appointment? Yes Patient has a new patient appt set for 01/17/2024. Currently does not have a PCP.   Can we respond through MyChart? No  Agent: Please be advised that Rx refills may take up to 3 business days. We ask that you follow-up with your pharmacy.

## 2023-12-04 ENCOUNTER — Ambulatory Visit (HOSPITAL_BASED_OUTPATIENT_CLINIC_OR_DEPARTMENT_OTHER): Admitting: Family Medicine

## 2024-01-17 ENCOUNTER — Encounter (HOSPITAL_BASED_OUTPATIENT_CLINIC_OR_DEPARTMENT_OTHER): Payer: Self-pay | Admitting: Family Medicine

## 2024-01-17 ENCOUNTER — Ambulatory Visit (HOSPITAL_BASED_OUTPATIENT_CLINIC_OR_DEPARTMENT_OTHER): Admitting: Family Medicine

## 2024-01-17 VITALS — BP 156/102 | HR 83 | Ht 69.0 in | Wt 202.5 lb

## 2024-01-17 DIAGNOSIS — Z0000 Encounter for general adult medical examination without abnormal findings: Secondary | ICD-10-CM

## 2024-01-17 DIAGNOSIS — F419 Anxiety disorder, unspecified: Secondary | ICD-10-CM | POA: Diagnosis not present

## 2024-01-17 DIAGNOSIS — R7303 Prediabetes: Secondary | ICD-10-CM | POA: Diagnosis not present

## 2024-01-17 DIAGNOSIS — I10 Essential (primary) hypertension: Secondary | ICD-10-CM | POA: Diagnosis not present

## 2024-01-17 MED ORDER — AMLODIPINE BESYLATE 10 MG PO TABS: 10.0000 mg | ORAL_TABLET | Freq: Every day | ORAL | 1 refills | Status: DC

## 2024-01-17 MED ORDER — ESCITALOPRAM OXALATE 5 MG PO TABS: 5.0000 mg | ORAL_TABLET | Freq: Every day | ORAL | 1 refills | Status: DC

## 2024-01-17 MED ORDER — PROPRANOLOL HCL 20 MG PO TABS: 20.0000 mg | ORAL_TABLET | Freq: Two times a day (BID) | ORAL | 1 refills | Status: DC

## 2024-01-17 NOTE — Assessment & Plan Note (Signed)
 Blood pressure is above goal in office today.  Patient has been without antihypertensive medication for couple months. We can look to resume medications.  Advised on restarting amlodipine  at lower dose for couple weeks and then increasing to 10 mg dose which he was taking previously.  Recommend intermittent monitoring of blood pressure at home, DASH diet.  Will monitor blood pressure closely to determine any further changes needed with medication regimen to better control blood pressure

## 2024-01-17 NOTE — Progress Notes (Signed)
 New Patient Office Visit  Subjective   Patient ID: Jose Cross, male    DOB: August 11, 1991  Age: 33 y.o. MRN: [id number]  CC:  Chief Complaint  Patient presents with   New Patient (Initial Visit)    New patient has not had pcp in awhile needs medication refills out of meds for 2 months     HPI Jose Cross presents to establish care Last PCP - none recently  HTN: Patient without recent PCP and thus has not had optimal management of blood pressure.  He was found to have elevated blood pressure during emergency department visit and was being managed on amlodipine  and propranolol , however is currently out of medications.  Has been without medications for about 2 months now.  No current issues with chest pain or headaches.  Denies having any issues with amlodipine  when taking this previously.  Did have blood pressure cuff at home, however reports that this was recently broken.  Migraines: was started on propanolol to assist with control, out of medication currently. Requesting refill today. Also with history of stroke, had evaluation with neurology. No follow-up scheduled now. Taking ASA 81.  Concerns about anxiety. Notes symptoms most days of the week. Notes increased anxiousness. Has had some symptoms when younger, but symptoms now are more so affecting daily activity and being more intrusive and affecting work and home life.  Diagnosed with chronic left lenticular nucleus infarct at emergency department visit.  Did have follow-up with neurologist.  As above, patient was started on propranolol .  He also takes ASA 81.  Patient is originally from [place]. 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 He enjoys playing video games, listening to music, exercising.  Outpatient Encounter Medications as of 01/17/2024  Medication Sig   escitalopram (LEXAPRO) 5 MG tablet Take 1 tablet (5 mg total) by mouth daily.   amLODipine  (NORVASC ) 10 MG tablet Take 1 tablet (10 mg total)  by mouth daily. Start with half tablet daily for 2 weeks, then increase to full tablet daily.   aspirin  81 MG chewable tablet Chew 1 tablet (81 mg total) by mouth daily. (Patient not taking: Reported on 01/17/2024)   propranolol  (INDERAL ) 20 MG tablet Take 1 tablet (20 mg total) by mouth 2 (two) times daily.   [DISCONTINUED] amLODipine  (NORVASC ) 10 MG tablet Take 1 tablet (10 mg total) by mouth daily.   [DISCONTINUED] propranolol  (INDERAL ) 20 MG tablet Take 1 tablet (20 mg total) by mouth 2 (two) times daily. (Patient not taking: Reported on 01/17/2024)   No facility-administered encounter medications on file as of 01/17/2024.    Past Medical History:  Diagnosis Date   Hypertension    Stroke [facility])     History reviewed. No pertinent surgical history.  Family History  Problem Relation Age of Onset   Stroke Maternal Uncle     Social History   Socioeconomic History   Marital status: Single    Spouse name: Not on file   Number of children: Not on file   Years of education: Not on file   Highest education level: 12th grade  Occupational History   Occupation: TEam  [employer]    Comment: [employer]  Tobacco Use   Smoking status: Some Days    Types: Cigars    Passive exposure: Current   Smokeless tobacco: Never   Tobacco comments:    Black and milds. States has not smoked since December.   Vaping Use   Vaping status: Never Used  Substance and Sexual Activity   Alcohol use: Yes    Alcohol/week: 2.0 standard drinks of alcohol    Types: 1 Cans of beer, 1 Shots of liquor per week    Comment: occ   Drug use: Yes    Types: Marijuana   Sexual activity: Not on file  Other Topics Concern   Not on file  Social History Narrative   Not on file   Social Drivers of Health   Financial Resource Strain: Low Risk  (01/15/2024)   Overall Financial Resource Strain (CARDIA)    Difficulty of Paying Living Expenses: Not very hard  Food Insecurity: No Food Insecurity (01/15/2024)   Hunger  Vital Sign    Worried About Running Out of Food in the Last Year: Never true    Ran Out of Food in the Last Year: Never true  Transportation Needs: No Transportation Needs (01/15/2024)   PRAPARE - Administrator, Civil Service (Medical): No    Lack of Transportation (Non-Medical): No  Physical Activity: Sufficiently Active (01/15/2024)   Exercise Vital Sign    Days of Exercise per Week: 3 days    Minutes of Exercise per Session: 60 min  Stress: Stress Concern Present (01/15/2024)   [employer] of Occupational Health - Occupational Stress Questionnaire    Feeling of Stress : Rather much  Social Connections: Socially Isolated (01/15/2024)   Social Connection and Isolation Panel [NHANES]    Frequency of Communication with Friends and Family: Twice a week    Frequency of Social Gatherings with Friends and Family: Once a week    Attends Religious Services: Never    Database administrator or Organizations: No    Attends Engineer, structural: Not on file    Marital Status: Separated  Intimate Partner Violence: Not on file    Objective   BP (!) 156/102 (BP Location: Right Arm, Patient Position: Sitting, Cuff Size: Normal)   Pulse 83   Ht 5\' 9"  (1.753 m)   Wt 202 lb 8 oz (91.9 kg)   SpO2 97%   BMI 29.90 kg/m   Physical Exam  33 year old male in no acute distress Cardiovascular exam with regular rate and rhythm Lungs clear to auscultation bilaterally  Assessment & Plan:   Primary hypertension Assessment & Plan: Blood pressure is above goal in office today.  Patient has been without antihypertensive medication for couple months. We can look to resume medications.  Advised on restarting amlodipine  at lower dose for couple weeks and then increasing to 10 mg dose which he was taking previously.  Recommend intermittent monitoring of blood pressure at home, DASH diet.  Will monitor blood pressure closely to determine any further changes needed with medication regimen  to better control blood pressure   Wellness examination -     CBC with Differential/Platelet; Future -     Comprehensive metabolic panel with GFR; Future -     Hemoglobin A1c; Future -     Lipid panel; Future -     TSH Rfx on Abnormal to Free T4; Future  Prediabetes Assessment & Plan: Hemoglobin A1c completed previously with neurology.  Was found to be within prediabetes range.  He reports family history of diabetes.  Discussed general conservative recommendations.  We can plan to monitor hemoglobin A1c moving forward   Anxiety Assessment & Plan: Given current symptoms, impact to your day-to-day life in multiple settings, do feel that patient has underlying anxiety.  We discussed treatment options.  We can  proceed with initial low-dose SSRI.  Cautioned on potential side effects.  Will plan to follow-up in about 2 weeks to assess progress with medication or sooner as needed.  Also discussed consideration for counseling/therapy.  Will hold off on this for now.  Can place referral at patient preference   Other orders -     Propranolol  HCl; Take 1 tablet (20 mg total) by mouth 2 (two) times daily.  Dispense: 180 tablet; Refill: 1 -     amLODIPine  Besylate; Take 1 tablet (10 mg total) by mouth daily. Start with half tablet daily for 2 weeks, then increase to full tablet daily.  Dispense: 90 tablet; Refill: 1 -     Escitalopram Oxalate; Take 1 tablet (5 mg total) by mouth daily.  Dispense: 30 tablet; Refill: 1  Return in about 2 weeks (around 01/31/2024) for med check, can be virtual.  CPE in 6 weeks with labs 1 week prior   ___________________________________________ Rachelanne Whidby de Peru, MD, ABFM, CAQSM Primary Care and Sports Medicine [facility]

## 2024-01-17 NOTE — Assessment & Plan Note (Signed)
 Given current symptoms, impact to your day-to-day life in multiple settings, do feel that patient has underlying anxiety.  We discussed treatment options.  We can proceed with initial low-dose SSRI.  Cautioned on potential side effects.  Will plan to follow-up in about 2 weeks to assess progress with medication or sooner as needed.  Also discussed consideration for counseling/therapy.  Will hold off on this for now.  Can place referral at patient preference

## 2024-01-17 NOTE — Patient Instructions (Signed)
  Medication Instructions:  Your physician recommends that you continue on your current medications as directed. Please refer to the Current Medication list given to you today. --If you need a refill on any your medications before your next appointment, please call your pharmacy first. If no refills are authorized on file call the office.-- Lab Work: Your physician has recommended that you have lab work today: 5 weeks If you have labs (blood work) drawn today and your tests are completely normal, you will receive your results via MyChart message OR a phone call from our staff.  Please ensure you check your voicemail in the event that you authorized detailed messages to be left on a delegated number. If you have any lab test that is abnormal or we need to change your treatment, we will call you to review the results.  Follow-Up: Your next appointment:   Your physician recommends that you schedule a follow-up appointment in: 2 weeks virtual / 6 weeks physical  with Dr. de Peru  You will receive a text message or e-mail with a link to a survey about your care and experience with us  today! We would greatly appreciate your feedback!   Thanks for letting us  be apart of your health journey!!  Primary Care and Sports Medicine   Dr. Court Distance Peru   We encourage you to activate your patient portal called "MyChart".  Sign up information is provided on this After Visit Summary.  MyChart is used to connect with patients for Virtual Visits (Telemedicine).  Patients are able to view lab/test results, encounter notes, upcoming appointments, etc.  Non-urgent messages can be sent to your provider as well. To learn more about what you can do with MyChart, please visit --  [email or web address].

## 2024-01-17 NOTE — Assessment & Plan Note (Signed)
 Hemoglobin A1c completed previously with neurology.  Was found to be within prediabetes range.  He reports family history of diabetes.  Discussed general conservative recommendations.  We can plan to monitor hemoglobin A1c moving forward

## 2024-02-01 ENCOUNTER — Encounter (HOSPITAL_BASED_OUTPATIENT_CLINIC_OR_DEPARTMENT_OTHER): Payer: Self-pay | Admitting: Family Medicine

## 2024-02-01 ENCOUNTER — Telehealth (HOSPITAL_BASED_OUTPATIENT_CLINIC_OR_DEPARTMENT_OTHER): Payer: Self-pay | Admitting: Family Medicine

## 2024-02-01 DIAGNOSIS — F419 Anxiety disorder, unspecified: Secondary | ICD-10-CM

## 2024-02-01 DIAGNOSIS — G43909 Migraine, unspecified, not intractable, without status migrainosus: Secondary | ICD-10-CM | POA: Insufficient documentation

## 2024-02-01 MED ORDER — ESCITALOPRAM OXALATE 10 MG PO TABS: 10.0000 mg | ORAL_TABLET | Freq: Every day | ORAL | 1 refills | Status: AC

## 2024-02-01 NOTE — Assessment & Plan Note (Signed)
 Prescription for propranolol  was sent to pharmacy, however patient indicates that this was not covered by insurance and was going to be expensive and thus he did not start medication.  Uncertain as to why this was not covered well.  We did review alternatives including utilization of goodrx.com which can assist with making medications more affordable if they are not covered well by insurance.  He will look further into this and verify any insurance coverage related to medication.  He does plan to start medication in the near future.  We will monitor this at next appointment

## 2024-02-01 NOTE — Progress Notes (Signed)
   Virtual Visit   I connected with  Jose Cross  on 02/01/24 by telehealth and verified that I am speaking with the correct person using two identifiers. Visit completed via video.   I discussed the limitations, risks, security and privacy concerns of performing an evaluation and management service by telephone, including the higher likelihood of inaccurate diagnosis and treatment, and the availability of in person appointments.  We also discussed the likely need of an additional face to face encounter for complete and high quality delivery of care.  I also discussed with the patient that there may be a patient responsible charge related to this service. The patient expressed understanding and wishes to proceed.  Provider location is in medical facility. Patient location is at their home, different from provider location. People involved in care of the patient during this telehealth encounter were myself, my nurse/medical assistant, and my front office/scheduling team member.  Review of Systems: No fevers, chills, night sweats, weight loss, chest pain, or shortness of breath.   Objective Findings:    General: Speaking full sentences, no audible heavy breathing.  Sounds alert and appropriately interactive.  Independent interpretation of tests performed by another provider:   None.  Brief History, Exam, Impression, and Recommendations:    Anxiety Patient did start with low-dose of escitalopram .  He indicates that he has been tolerating medication, however has not had significant change in regards to symptoms.  We discussed options today regarding medication management and he would be open to titrating dose to 10 mg.  Cautioned on potential side effects.  He will utilize medication that he has at home and I will also send prescription to pharmacy for 10 mg tablet.  Will plan to assess response in about 1 month at time of physical.  Migraines Prescription for propranolol  was sent to  pharmacy, however patient indicates that this was not covered by insurance and was going to be expensive and thus he did not start medication.  Uncertain as to why this was not covered well.  We did review alternatives including utilization of goodrx.com which can assist with making medications more affordable if they are not covered well by insurance.  He will look further into this and verify any insurance coverage related to medication.  He does plan to start medication in the near future.  We will monitor this at next appointment  I discussed the above assessment and treatment plan with the patient. The patient was provided an opportunity to ask questions and all were answered. The patient agreed with the plan and demonstrated an understanding of the instructions.  The patient was advised to call back or seek an in-person evaluation if the symptoms worsen or if the condition fails to improve as anticipated.  I provided 12 minutes of face to face and non-face-to-face time during this encounter date, time was needed to gather information, review chart, records, communicate/coordinate with staff remotely, as well as complete documentation.   ___________________________________________ Nabila Albarracin de [place], MD, ABFM, CAQSM Primary Care and Sports Medicine [facility]

## 2024-02-01 NOTE — Assessment & Plan Note (Signed)
 Patient did start with low-dose of escitalopram .  He indicates that he has been tolerating medication, however has not had significant change in regards to symptoms.  We discussed options today regarding medication management and he would be open to titrating dose to 10 mg.  Cautioned on potential side effects.  He will utilize medication that he has at home and I will also send prescription to pharmacy for 10 mg tablet.  Will plan to assess response in about 1 month at time of physical.

## 2024-03-13 ENCOUNTER — Encounter (HOSPITAL_BASED_OUTPATIENT_CLINIC_OR_DEPARTMENT_OTHER): Payer: Self-pay | Admitting: Family Medicine

## 2024-03-13 ENCOUNTER — Ambulatory Visit (HOSPITAL_BASED_OUTPATIENT_CLINIC_OR_DEPARTMENT_OTHER): Payer: Self-pay | Admitting: Family Medicine

## 2024-03-13 VITALS — BP 165/102 | HR 72 | Temp 99.7°F | Ht 69.0 in | Wt 202.3 lb

## 2024-03-13 DIAGNOSIS — I10 Essential (primary) hypertension: Secondary | ICD-10-CM

## 2024-03-13 DIAGNOSIS — Z0000 Encounter for general adult medical examination without abnormal findings: Secondary | ICD-10-CM | POA: Insufficient documentation

## 2024-03-13 NOTE — Assessment & Plan Note (Signed)
 Blood pressure notably elevated on initial reading, did improve on recheck.  Patient reports that due to financial strain, he has not been able to afford medication recently.  He has been without amlodipine  for about a week.  He does plan on picking medication up later this week. We again reviewed considerations, we discussed options to help with controlling cost of medications.  Discussed utilizing goodrx.com to review prices for prescriptions at local pharmacies.  Also discussed possible referral to [facility] to assist with medication, patient declines today. Recommend intermittent monitoring of blood pressure home, DASH diet We will plan for close follow-up on blood pressure

## 2024-03-13 NOTE — Progress Notes (Signed)
 Subjective:    CC: Annual Physical Exam  HPI:  Jose Cross is a 33 y.o. presenting for annual physical  I reviewed the past medical history, family history, social history, surgical history, and allergies today and no changes were needed.  Please see the problem list section below in epic for further details.  Past Medical History: Past Medical History:  Diagnosis Date   Hypertension    Stroke [facility])    Past Surgical History: History reviewed. No pertinent surgical history. 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 States has not smoked since December.   Vaping Use   Vaping status: Never Used  Substance and Sexual Activity   Alcohol use: Yes    Alcohol/week: 2.0 standard drinks of alcohol    Types: 1 Cans of beer, 1 Shots of liquor per week    Comment: occ   Drug use: Yes    Types: Marijuana   Sexual activity: Not on file  Other Topics Concern   Not on file  Social History Narrative   Not on file   Social Drivers of Health   Financial Resource Strain: Low Risk  (01/15/2024)   Overall Financial Resource Strain (CARDIA)    Difficulty of Paying Living Expenses: Not very hard  Food Insecurity: No Food Insecurity (01/15/2024)   Hunger Vital Sign    Worried About Running Out of Food in the Last Year: Never true    Ran Out of Food in the Last Year: Never true  Transportation Needs: No Transportation Needs (01/15/2024)   PRAPARE - Administrator, Civil Service (Medical): No    Lack of Transportation (Non-Medical): No  Physical Activity:  Sufficiently Active (01/15/2024)   Exercise Vital Sign    Days of Exercise per Week: 3 days    Minutes of Exercise per Session: 60 min  Stress: Stress Concern Present (01/15/2024)   [employer] of Occupational Health - Occupational Stress Questionnaire    Feeling of Stress : Rather much  Social Connections: Socially Isolated (01/15/2024)   Social Connection and Isolation Panel    Frequency of Communication with Friends and Family: Twice a week    Frequency of Social Gatherings with Friends and Family: Once a week    Attends Religious Services: Never    Database administrator or Organizations: No    Attends Engineer, structural: Not on file    Marital Status: Separated   Family History: Family History  Problem Relation Age of Onset   Stroke Maternal Uncle    Allergies: No Known Allergies Medications: See med rec.  Review of Systems: No headache, visual changes, nausea, vomiting, diarrhea, constipation, dizziness, abdominal pain, skin rash, fevers, chills, night sweats, swollen lymph nodes, weight loss, chest pain, body aches, joint swelling, muscle aches, shortness of breath, mood changes, visual or auditory hallucinations.  Objective:    BP (!) 165/102 (BP Location: Left Arm, Patient Position: Sitting, Cuff Size: Normal)   Pulse 72   Temp 99.7  F (37.6 C) (Oral)   Ht 5' 9 (1.753 m)   Wt 202 lb 4.8 oz (91.8 kg)   SpO2 99%   BMI 29.87 kg/m   General: Well Developed, well nourished, and in no acute distress. Neuro: Alert and oriented x3, extra-ocular muscles intact, sensation grossly intact. Cranial nerves II through XII are intact, motor, sensory, and coordinative functions are all intact. HEENT: Normocephalic, atraumatic, pupils equal round reactive to light, neck supple, no masses, no lymphadenopathy, thyroid nonpalpable. Oropharynx, nasopharynx, external ear canals are unremarkable. Skin: Warm and dry, no rashes noted. Cardiac: Regular rate and rhythm, no murmurs  rubs or gallops. Respiratory: Clear to auscultation bilaterally. Not using accessory muscles, speaking in full sentences. Abdominal: Soft, nontender, nondistended, positive bowel sounds, no masses, no organomegaly. Musculoskeletal: Shoulder, elbow, wrist, hip, knee, ankle stable, and with full range of motion.  Impression and Recommendations:    Wellness examination Assessment & Plan: Routine HCM labs ordered. HCM reviewed/discussed. Anticipatory guidance regarding healthy weight, lifestyle and choices given. Recommend healthy diet.  Recommend approximately 150 minutes/week of moderate intensity exercise Recommend regular dental and vision exams Always use seatbelt/lap and shoulder restraints Recommend using smoke alarms and checking batteries at least twice a year Recommend using sunscreen when outside Discussed immunization recommendations  Orders: -     TSH Rfx on Abnormal to Free T4 -     Lipid panel -     Hemoglobin A1c -     Comprehensive metabolic panel with GFR -     CBC with Differential/Platelet  Primary hypertension Assessment & Plan: Blood pressure notably elevated on initial reading, did improve on recheck.  Patient reports that due to financial strain, he has not been able to afford medication recently.  He has been without amlodipine  for about a week.  He does plan on picking medication up later this week. We again reviewed considerations, we discussed options to help with controlling cost of medications.  Discussed utilizing goodrx.com to review prices for prescriptions at local pharmacies.  Also discussed possible referral to [facility] to assist with medication, patient declines today. Recommend intermittent monitoring of blood pressure home, DASH diet We will plan for close follow-up on blood pressure   Return in about 4 weeks (around 04/10/2024) for hypertension, med check.   ___________________________________________ Rupal Childress de [place], MD, ABFM, [facility] Primary Care  and Sports Medicine [facility]

## 2024-03-13 NOTE — Assessment & Plan Note (Signed)

## 2024-03-14 LAB — CBC WITH DIFFERENTIAL/PLATELET
Basophils Absolute: 0.1 x10E3/uL (ref 0.0–0.2)
Basos: 1 %
EOS (ABSOLUTE): 0.1 x10E3/uL (ref 0.0–0.4)
Eos: 3 %
Hematocrit: 45 % (ref 37.5–51.0)
Hemoglobin: 14.8 g/dL (ref 13.0–17.7)
Immature Grans (Abs): 0 x10E3/uL (ref 0.0–0.1)
Immature Granulocytes: 0 %
Lymphocytes Absolute: 2.1 x10E3/uL (ref 0.7–3.1)
Lymphs: 43 %
MCH: 30.3 pg (ref 26.6–33.0)
MCHC: 32.9 g/dL (ref 31.5–35.7)
MCV: 92 fL (ref 79–97)
Monocytes Absolute: 0.6 x10E3/uL (ref 0.1–0.9)
Monocytes: 12 %
Neutrophils Absolute: 2 x10E3/uL (ref 1.4–7.0)
Neutrophils: 41 %
Platelets: 293 x10E3/uL (ref 150–450)
RBC: 4.88 x10E6/uL (ref 4.14–5.80)
RDW: 12.7 % (ref 11.6–15.4)
WBC: 4.8 x10E3/uL (ref 3.4–10.8)

## 2024-03-14 LAB — LIPID PANEL
Chol/HDL Ratio: 2.9 ratio (ref 0.0–5.0)
Cholesterol, Total: 164 mg/dL (ref 100–199)
HDL: 56 mg/dL (ref >39)
LDL Chol Calc (NIH): 96 mg/dL (ref 0–99)
Triglycerides: 59 mg/dL (ref 0–149)
VLDL Cholesterol Cal: 12 mg/dL (ref 5–40)

## 2024-03-14 LAB — COMPREHENSIVE METABOLIC PANEL WITH GFR
ALT: 18 IU/L (ref 0–44)
AST: 19 IU/L (ref 0–40)
Albumin: 4.6 g/dL (ref 4.1–5.1)
Alkaline Phosphatase: 68 IU/L (ref 44–121)
BUN/Creatinine Ratio: 10 (ref 9–20)
BUN: 9 mg/dL (ref 6–20)
Bilirubin Total: 0.5 mg/dL (ref 0.0–1.2)
CO2: 22 mmol/L (ref 20–29)
Calcium: 9.7 mg/dL (ref 8.7–10.2)
Chloride: 100 mmol/L (ref 96–106)
Creatinine, Ser: 0.89 mg/dL (ref 0.76–1.27)
Globulin, Total: 3 g/dL (ref 1.5–4.5)
Glucose: 89 mg/dL (ref 70–99)
Potassium: 4.4 mmol/L (ref 3.5–5.2)
Sodium: 139 mmol/L (ref 134–144)
Total Protein: 7.6 g/dL (ref 6.0–8.5)
eGFR: 116 mL/min/1.73 (ref >59)

## 2024-03-14 LAB — HEMOGLOBIN A1C
Est. average glucose Bld gHb Est-mCnc: 117 mg/dL
Hgb A1c MFr Bld: 5.7 % — ABNORMAL HIGH (ref 4.8–5.6)

## 2024-03-14 LAB — TSH RFX ON ABNORMAL TO FREE T4: TSH: 1.47 u[IU]/mL (ref 0.450–4.500)

## 2024-04-25 ENCOUNTER — Ambulatory Visit (INDEPENDENT_AMBULATORY_CARE_PROVIDER_SITE_OTHER): Payer: Self-pay | Admitting: Family Medicine

## 2024-04-25 ENCOUNTER — Encounter (HOSPITAL_BASED_OUTPATIENT_CLINIC_OR_DEPARTMENT_OTHER): Payer: Self-pay | Admitting: Family Medicine

## 2024-04-25 ENCOUNTER — Encounter (HOSPITAL_BASED_OUTPATIENT_CLINIC_OR_DEPARTMENT_OTHER): Payer: Self-pay | Admitting: *Deleted

## 2024-04-25 VITALS — BP 159/116 | HR 81 | Ht 69.0 in | Wt 203.3 lb

## 2024-04-25 DIAGNOSIS — I10 Essential (primary) hypertension: Secondary | ICD-10-CM

## 2024-04-25 MED ORDER — PROPRANOLOL HCL 20 MG PO TABS: 20.0000 mg | ORAL_TABLET | Freq: Two times a day (BID) | ORAL | 1 refills | Status: AC

## 2024-04-25 MED ORDER — AMLODIPINE BESYLATE 10 MG PO TABS: 10.0000 mg | ORAL_TABLET | Freq: Every day | ORAL | 1 refills | Status: AC

## 2024-04-25 NOTE — Progress Notes (Signed)
    Procedures performed today:    None.  Independent interpretation of notes and tests performed by another provider:   None.  Brief History, Exam, Impression, and Recommendations:    BP (!) 159/116 (BP Location: Right Arm, Patient Position: Sitting, Cuff Size: Normal)   Pulse 81   Ht 5' 9 (1.753 m)   Wt 203 lb 4.8 oz (92.2 kg)   SpO2 98%   BMI 30.02 kg/m   Primary hypertension Assessment & Plan: Blood pressure elevated in office today.  Unfortunately, patient still has not been able to resume medication.  He reports that he we will be able to pick it up in the very near future to restart it.  Given duration of time and he has been without medication, order for him to resume lower dose of amlodipine .  Can continue with 10 mg tablet, advised on taking 1/2 tablet initially until we see each other again to review progress and then consider increasing to full tablet at that time if blood pressure still above goal Plan for follow-up in about 3 to 4 weeks to assess progress.  Recommend intermittent monitoring of blood pressure at home, DASH diet   Other orders -     amLODIPine  Besylate; Take 1 tablet (10 mg total) by mouth daily. Start with half tablet daily for 2 weeks, then increase to full tablet daily.  Dispense: 90 tablet; Refill: 1 -     Propranolol  HCl; Take 1 tablet (20 mg total) by mouth 2 (two) times daily.  Dispense: 180 tablet; Refill: 1  Return in about 1 month (around 05/25/2024) for hypertension, med check.   ___________________________________________ Donte Lenzo de [place], MD, ABFM, CAQSM Primary Care and Sports Medicine [facility]

## 2024-04-25 NOTE — Assessment & Plan Note (Signed)
 Blood pressure elevated in office today.  Unfortunately, patient still has not been able to resume medication.  He reports that he we will be able to pick it up in the very near future to restart it.  Given duration of time and he has been without medication, order for him to resume lower dose of amlodipine .  Can continue with 10 mg tablet, advised on taking 1/2 tablet initially until we see each other again to review progress and then consider increasing to full tablet at that time if blood pressure still above goal Plan for follow-up in about 3 to 4 weeks to assess progress.  Recommend intermittent monitoring of blood pressure at home, DASH diet

## 2024-04-25 NOTE — Patient Instructions (Signed)
  Medication Instructions:  Your physician recommends that you continue on your current medications as directed. Please refer to the Current Medication list given to you today. --If you need a refill on any your medications before your next appointment, please call your pharmacy first. If no refills are authorized on file call the office.--    Follow-Up: Your next appointment:   Your physician recommends that you schedule a follow-up appointment in: 1 month follow up  with Dr. de [place]  You will receive a text message or e-mail with a link to a survey about your care and experience with [place] today! We would greatly appreciate your feedback!   Thanks for letting us be apart of your health journey!!  Primary Care and Sports Medicine   Dr. Arlina Robes [place]   We encourage you to activate your patient portal called "MyChart".  Sign up information is provided on this After Visit Summary.  MyChart is used to connect with patients for Virtual Visits (Telemedicine).  Patients are able to view lab/test results, encounter notes, upcoming appointments, etc.  Non-urgent messages can be sent to your provider as well. To learn more about what you can do with MyChart, please visit --  [email or web address].

## 2024-04-30 ENCOUNTER — Encounter (HOSPITAL_BASED_OUTPATIENT_CLINIC_OR_DEPARTMENT_OTHER): Admitting: Family Medicine

## 2024-06-05 ENCOUNTER — Ambulatory Visit (HOSPITAL_BASED_OUTPATIENT_CLINIC_OR_DEPARTMENT_OTHER): Admitting: Family Medicine

## 2024-06-12 ENCOUNTER — Ambulatory Visit (HOSPITAL_BASED_OUTPATIENT_CLINIC_OR_DEPARTMENT_OTHER): Admitting: Family Medicine

## 2024-06-24 ENCOUNTER — Ambulatory Visit (HOSPITAL_BASED_OUTPATIENT_CLINIC_OR_DEPARTMENT_OTHER): Admitting: Family Medicine
